# Patient Record
Sex: Male | Born: 1970 | Hispanic: No | Marital: Married | Smoking: Never smoker
Health system: Southern US, Community
[De-identification: ages and names within clinical notes are randomized; demographics above are authoritative.]

---

## 2017-09-04 ENCOUNTER — Encounter: Payer: Self-pay | Admitting: Urgent Care

## 2017-09-04 ENCOUNTER — Ambulatory Visit: Payer: BLUE CROSS/BLUE SHIELD | Admitting: Urgent Care

## 2017-09-04 ENCOUNTER — Other Ambulatory Visit: Payer: Self-pay

## 2017-09-04 VITALS — BP 118/74 | HR 93 | Temp 98.8°F | Resp 18 | Ht 67.0 in | Wt 187.6 lb

## 2017-09-04 DIAGNOSIS — R5383 Other fatigue: Secondary | ICD-10-CM

## 2017-09-04 DIAGNOSIS — Z114 Encounter for screening for human immunodeficiency virus [HIV]: Secondary | ICD-10-CM

## 2017-09-04 DIAGNOSIS — R110 Nausea: Secondary | ICD-10-CM | POA: Diagnosis not present

## 2017-09-04 DIAGNOSIS — M545 Low back pain, unspecified: Secondary | ICD-10-CM

## 2017-09-04 DIAGNOSIS — R5381 Other malaise: Secondary | ICD-10-CM | POA: Diagnosis not present

## 2017-09-04 DIAGNOSIS — R140 Abdominal distension (gaseous): Secondary | ICD-10-CM

## 2017-09-04 DIAGNOSIS — R519 Headache, unspecified: Secondary | ICD-10-CM

## 2017-09-04 DIAGNOSIS — R1013 Epigastric pain: Secondary | ICD-10-CM | POA: Diagnosis not present

## 2017-09-04 DIAGNOSIS — R51 Headache: Secondary | ICD-10-CM

## 2017-09-04 NOTE — Progress Notes (Signed)
   MRN: [id number] DOB: 04/05/1971  Subjective:   Douglas Ortiz is a 47 y.o. male presenting for 2 week history of low back pain (now resolved), warm sensation, abdominal bloating, intermittent epigastric pain, intermittent nausea, has been burping. Has also had headaches over right temporal side relieved with Advil, has felt fatigue. Denies fever, dizziness, sore throat, cough, chest pain, vomiting, diarrhea, constipation, decreased appetite, dysuria, hematuria, rashes. Denies smoking cigarettes or drinking alcohol. Patient quit smoking ~2 cigarettes daily. He does not do flu shots, is not up to date on vaccines. Hydrates well. Diet is mostly healthy. Sleeps 6-7 hours nightly.   Douglas Ortiz is not currently taking any medications and has No Known Allergies.  Douglas Ortiz denies past medical and surgical history. Denies family history of cancer, diabetes, HTN, HL, heart disease, stroke, mental illness.   Objective:   Vitals: BP 118/74 (BP Location: Left Arm, Patient Position: Sitting, Cuff Size: Large)   Pulse 93   Temp 98.8 F (37.1 C) (Oral)   Resp 18   Ht 5\' 7"  (1.702 m)   Wt 187 lb 9.6 oz (85.1 kg)   SpO2 100%   BMI 29.38 kg/m   Physical Exam  Constitutional: He is oriented to person, place, and time. He appears well-developed and well-nourished.  HENT:  Mouth/Throat: Oropharynx is clear and moist.  Eyes: Pupils are equal, round, and reactive to light. EOM are normal. No scleral icterus.  Neck: Normal range of motion. Neck supple. No thyromegaly present.  Cardiovascular: Normal rate, regular rhythm and intact distal pulses. Exam reveals no gallop and no friction rub.  No murmur heard. Pulmonary/Chest: No respiratory distress. He has no wheezes. He has no rales.  Abdominal: Soft. Bowel sounds are normal. He exhibits no distension and no mass. There is no tenderness. There is no guarding.  Musculoskeletal: He exhibits no edema.  Neurological: He is alert and oriented to person, place,  and time. He displays normal reflexes.  Skin: Skin is warm and dry. Capillary refill takes less than 2 seconds.  Psychiatric: He has a normal mood and affect.   Assessment and Plan :   Malaise and fatigue - Plan: CBC, TSH, Epstein-Barr virus VCA antibody panel, Vitamin B12  Generalized headaches - Plan: Comprehensive metabolic panel  Abdominal bloating  Abdominal pain, epigastric - Plan: CBC, HELICOBACTER PYLORI  ANTIBODY, IGM  Acute bilateral low back pain without sciatica  Nausea without vomiting  Screening for HIV (human immunodeficiency virus) - Plan: HIV antibody  Patient has multiple symptoms from different systems that do not fit any particular diagnosis. Labs pending. For now, recommended conservative management and general health maintenance. Will follow up with labs including treatment plan.  Wallis BambergMario Marionette Meskill, PA-C [facility] [phone] 09/04/2017  3:41 PM

## 2017-09-04 NOTE — Patient Instructions (Addendum)
You may take 500mg  Tylenol with ibuprofen 400-600mg  every 6 hours for pain and inflammation. Hydrate well with at least 2 liters (1 gallon) of water daily.     Fatigue Fatigue is feeling tired all of the time, a lack of energy, or a lack of motivation. Occasional or mild fatigue is often a normal response to activity or life in general. However, long-lasting (chronic) or extreme fatigue may indicate an underlying medical condition. Follow these instructions at home: Watch your fatigue for any changes. The following actions may help to lessen any discomfort you are feeling:  Talk to your health care provider about how much sleep you need each night. Try to get the required amount every night.  Take medicines only as directed by your health care provider.  Eat a healthy and nutritious diet. Ask your health care provider if you need help changing your diet.  Drink enough fluid to keep your urine clear or pale yellow.  Practice ways of relaxing, such as yoga, meditation, massage therapy, or acupuncture.  Exercise regularly.  Change situations that cause you stress. Try to keep your work and personal routine reasonable.  Do not abuse illegal drugs.  Limit alcohol intake to no more than 1 drink per day for nonpregnant women and 2 drinks per day for men. One drink equals 12 ounces of beer, 5 ounces of wine, or 1 ounces of hard liquor.  Take a multivitamin, if directed by your health care provider.  Contact a health care provider if:  Your fatigue does not get better.  You have a fever.  You have unintentional weight loss or gain.  You have headaches.  You have difficulty: ? Falling asleep. ? Sleeping throughout the night.  You feel angry, guilty, anxious, or sad.  You are unable to have a bowel movement (constipation).  You skin is dry.  Your legs or another part of your body is swollen. Get help right away if:  You feel confused.  Your vision is blurry.  You feel  faint or pass out.  You have a severe headache.  You have severe abdominal, pelvic, or back pain.  You have chest pain, shortness of breath, or an irregular or fast heartbeat.  You are unable to urinate or you urinate less than normal.  You develop abnormal bleeding, such as bleeding from the rectum, vagina, nose, lungs, or nipples.  You vomit blood.  You have thoughts about harming yourself or committing suicide.  You are worried that you might harm someone else. This information is not intended to replace advice given to you by your health care provider. Make sure you discuss any questions you have with your health care provider. Document Released: 03/17/2007 Document Revised: 10/26/2015 Document Reviewed: 09/21/2013 Elsevier Interactive Patient Education  2018 [employer].      Health Maintenance, Male A healthy lifestyle and preventive care is important for your health and wellness. Ask your health care provider about what schedule of regular examinations is right for you. What should I know about weight and diet? Eat a Healthy Diet  Eat plenty of vegetables, fruits, whole grains, low-fat dairy products, and lean protein.  Do not eat a lot of foods high in solid fats, added sugars, or salt.  Maintain a Healthy Weight Regular exercise can help you achieve or maintain a healthy weight. You should:  Do at least 150 minutes of exercise each week. The exercise should increase your heart rate and make you sweat (moderate-intensity exercise).  Do strength-training  exercises at least twice a week.  Watch Your Levels of Cholesterol and Blood Lipids  Have your blood tested for lipids and cholesterol every 5 years starting at 45 years of age. If you are at high risk for heart disease, you should start having your blood tested when you are 45 years old. You may need to have your cholesterol levels checked more often if: ? Your lipid or cholesterol levels are high. ? You are  older than 45 years of age. ? You are at high risk for heart disease.  What should I know about cancer screening? Many types of cancers can be detected early and may often be prevented. Lung Cancer  You should be screened every year for lung cancer if: ? You are a current smoker who has smoked for at least 30 years. ? You are a former smoker who has quit within the past 15 years.  Talk to your health care provider about your screening options, when you should start screening, and how often you should be screened.  Colorectal Cancer  Routine colorectal cancer screening usually begins at 45 years of age and should be repeated every 5-10 years until you are 45 years old. You may need to be screened more often if early forms of precancerous polyps or small growths are found. Your health care provider may recommend screening at an earlier age if you have risk factors for colon cancer.  Your health care provider may recommend using home test kits to check for hidden blood in the stool.  A small camera at the end of a tube can be used to examine your colon (sigmoidoscopy or colonoscopy). This checks for the earliest forms of colorectal cancer.  Prostate and Testicular Cancer  Depending on your age and overall health, your health care provider may do certain tests to screen for prostate and testicular cancer.  Talk to your health care provider about any symptoms or concerns you have about testicular or prostate cancer.  Skin Cancer  Check your skin from head to toe regularly.  Tell your health care provider about any new moles or changes in moles, especially if: ? There is a change in a mole's size, shape, or color. ? You have a mole that is larger than a pencil eraser.  Always use sunscreen. Apply sunscreen liberally and repeat throughout the day.  Protect yourself by wearing long sleeves, pants, a wide-brimmed hat, and sunglasses when outside.  What should I know about heart disease,  diabetes, and high blood pressure?  If you are 80-65 years of age, have your blood pressure checked every 3-5 years. If you are 43 years of age or older, have your blood pressure checked every year. You should have your blood pressure measured twice-once when you are at a hospital or clinic, and once when you are not at a hospital or clinic. Record the average of the two measurements. To check your blood pressure when you are not at a hospital or clinic, you can use: ? An automated blood pressure machine at a pharmacy. ? A home blood pressure monitor.  Talk to your health care provider about your target blood pressure.  If you are between 85-57 years old, ask your health care provider if you should take aspirin to prevent heart disease.  Have regular diabetes screenings by checking your fasting blood sugar level. ? If you are at a normal weight and have a low risk for diabetes, have this test once every three years after  the age of 45. ? If you are overweight and have a high risk for diabetes, consider being tested at a younger age or more often.  A one-time screening for abdominal aortic aneurysm (AAA) by ultrasound is recommended for men aged 65-75 years who are current or former smokers. What should I know about preventing infection? Hepatitis B If you have a higher risk for hepatitis B, you should be screened for this virus. Talk with your health care provider to find out if you are at risk for hepatitis B infection. Hepatitis C Blood testing is recommended for:  Everyone born from [birth year] through 1963.  Anyone with known risk factors for hepatitis C.  Sexually Transmitted Diseases (STDs)  You should be screened each year for STDs including gonorrhea and chlamydia if: ? You are sexually active and are younger than 45 years of age. ? You are older than 45 years of age and your health care provider tells you that you are at risk for this type of infection. ? Your sexual activity has  changed since you were last screened and you are at an increased risk for chlamydia or gonorrhea. Ask your health care provider if you are at risk.  Talk with your health care provider about whether you are at high risk of being infected with HIV. Your health care provider may recommend a prescription medicine to help prevent HIV infection.  What else can I do?  Schedule regular health, dental, and eye exams.  Stay current with your vaccines (immunizations).  Do not use any tobacco products, such as cigarettes, chewing tobacco, and e-cigarettes. If you need help quitting, ask your health care provider.  Limit alcohol intake to no more than 2 drinks per day. One drink equals 12 ounces of beer, 5 ounces of wine, or 1 ounces of hard liquor.  Do not use street drugs.  Do not share needles.  Ask your health care provider for help if you need support or information about quitting drugs.  Tell your health care provider if you often feel depressed.  Tell your health care provider if you have ever been abused or do not feel safe at home. This information is not intended to replace advice given to you by your health care provider. Make sure you discuss any questions you have with your health care provider. Document Released: 11/16/2007 Document Revised: 01/17/2016 Document Reviewed: 02/21/2015 Elsevier Interactive Patient Education  2018 [employer]      IF you received an x-ray today, you will receive an invoice from [facility]. Please contact [facility] at [phone] with questions or concerns regarding your invoice.   IF you received labwork today, you will receive an invoice from [facility]. Please contact [facility] at [phone] with questions or concerns regarding your invoice.   Our billing staff will not be able to assist you with questions regarding bills from these companies.  You will be contacted with the lab results as soon as they are available. The  fastest way to get your results is to activate your My Chart account. Instructions are located on the last page of this paperwork. If you have not heard from us regarding the results in 2 weeks, please contact this office.

## 2017-09-05 LAB — CBC
Hematocrit: 44.9 % (ref 37.5–51.0)
Hemoglobin: 15 g/dL (ref 13.0–17.7)
MCH: 30.6 pg (ref 26.6–33.0)
MCHC: 33.4 g/dL (ref 31.5–35.7)
MCV: 92 fL (ref 79–97)
PLATELETS: 310 10*3/uL (ref 150–379)
RBC: 4.9 x10E6/uL (ref 4.14–5.80)
RDW: 13.1 % (ref 12.3–15.4)
WBC: 4.4 10*3/uL (ref 3.4–10.8)

## 2017-09-05 LAB — COMPREHENSIVE METABOLIC PANEL
A/G RATIO: 1.9 (ref 1.2–2.2)
ALT: 31 IU/L (ref 0–44)
AST: 24 IU/L (ref 0–40)
Albumin: 4.7 g/dL (ref 3.5–5.5)
Alkaline Phosphatase: 83 IU/L (ref 39–117)
BILIRUBIN TOTAL: 0.4 mg/dL (ref 0.0–1.2)
BUN/Creatinine Ratio: 12 (ref 9–20)
BUN: 12 mg/dL (ref 6–24)
CALCIUM: 10 mg/dL (ref 8.7–10.2)
CHLORIDE: 100 mmol/L (ref 96–106)
CO2: 24 mmol/L (ref 20–29)
Creatinine, Ser: 0.97 mg/dL (ref 0.76–1.27)
GFR calc Af Amer: 109 mL/min/{1.73_m2} (ref >59)
GFR calc non Af Amer: 95 mL/min/{1.73_m2} (ref >59)
GLUCOSE: 99 mg/dL (ref 65–99)
Globulin, Total: 2.5 g/dL (ref 1.5–4.5)
POTASSIUM: 4.4 mmol/L (ref 3.5–5.2)
Sodium: 140 mmol/L (ref 134–144)
Total Protein: 7.2 g/dL (ref 6.0–8.5)

## 2017-09-05 LAB — EPSTEIN-BARR VIRUS VCA ANTIBODY PANEL
EBV Early Antigen Ab, IgG: <9 U/mL (ref 0.0–8.9)
EBV NA IgG: 147 U/mL — ABNORMAL HIGH (ref 0.0–17.9)
EBV VCA IgG: 538 U/mL — ABNORMAL HIGH (ref 0.0–17.9)
EBV VCA IgM: <36 U/mL (ref 0.0–35.9)

## 2017-09-05 LAB — HELICOBACTER PYLORI  ANTIBODY, IGM

## 2017-09-05 LAB — VITAMIN B12: Vitamin B-12: 402 pg/mL (ref 232–1245)

## 2017-09-05 LAB — HIV ANTIBODY (ROUTINE TESTING W REFLEX): HIV SCREEN 4TH GENERATION: NONREACTIVE

## 2017-09-05 LAB — TSH: TSH: 1.66 u[IU]/mL (ref 0.450–4.500)

## 2017-09-11 ENCOUNTER — Telehealth: Payer: Self-pay | Admitting: Urgent Care

## 2017-09-11 NOTE — Telephone Encounter (Signed)
Pt given results per notes of Jordan LikesMani Mario,PA on 09/08/17.Unable to document in result note due to result note not being [facility].Follow up appt scheduled for 4/12

## 2017-09-11 NOTE — Telephone Encounter (Signed)
Copied from CRM [id number]. Topic: General - Other >> Sep 11, 2017 11:40 AM Gerrianne ScalePayne, Bradly Sangiovanni L wrote: Reason for CRM: patient calling about lab results ok to give out per CRM

## 2017-09-12 ENCOUNTER — Ambulatory Visit: Payer: BLUE CROSS/BLUE SHIELD | Admitting: Urgent Care

## 2017-09-12 ENCOUNTER — Encounter: Payer: Self-pay | Admitting: Urgent Care

## 2017-09-12 ENCOUNTER — Other Ambulatory Visit: Payer: Self-pay

## 2017-09-12 ENCOUNTER — Ambulatory Visit (INDEPENDENT_AMBULATORY_CARE_PROVIDER_SITE_OTHER): Payer: BLUE CROSS/BLUE SHIELD

## 2017-09-12 VITALS — BP 118/70 | HR 67 | Temp 98.1°F | Resp 16 | Ht 67.0 in | Wt 186.0 lb

## 2017-09-12 DIAGNOSIS — R5381 Other malaise: Secondary | ICD-10-CM

## 2017-09-12 DIAGNOSIS — K5900 Constipation, unspecified: Secondary | ICD-10-CM | POA: Insufficient documentation

## 2017-09-12 DIAGNOSIS — R0683 Snoring: Secondary | ICD-10-CM

## 2017-09-12 DIAGNOSIS — R140 Abdominal distension (gaseous): Secondary | ICD-10-CM | POA: Diagnosis not present

## 2017-09-12 DIAGNOSIS — R5383 Other fatigue: Secondary | ICD-10-CM

## 2017-09-12 DIAGNOSIS — R0600 Dyspnea, unspecified: Secondary | ICD-10-CM | POA: Diagnosis not present

## 2017-09-12 DIAGNOSIS — R1030 Lower abdominal pain, unspecified: Secondary | ICD-10-CM

## 2017-09-12 DIAGNOSIS — R142 Eructation: Secondary | ICD-10-CM

## 2017-09-12 LAB — HEMOGLOBIN A1C
Est. average glucose Bld gHb Est-mCnc: 111 mg/dL
Hgb A1c MFr Bld: 5.5 % (ref 4.8–5.6)

## 2017-09-12 MED ORDER — DOCUSATE SODIUM 50 MG PO CAPS: 50.0000 mg | ORAL_CAPSULE | Freq: Two times a day (BID) | ORAL | 0 refills | Status: AC

## 2017-09-12 MED ORDER — RANITIDINE HCL 150 MG PO TABS
150.0000 mg | ORAL_TABLET | Freq: Two times a day (BID) | ORAL | 1 refills | Status: AC
Start: 2017-09-12 — End: ?

## 2017-09-12 MED ORDER — OMEPRAZOLE 20 MG PO CPDR: 20.0000 mg | DELAYED_RELEASE_CAPSULE | Freq: Every day | ORAL | 3 refills | Status: AC

## 2017-09-12 NOTE — Patient Instructions (Addendum)
Please start Colace (docusate) stool softener, twice a day for at least 1 week. If stools become loose, cut down to once a day for another week. If stools remain loose, cut back to 1 pill every other day for a third week. You can stop docusate thereafter and resume as needed for constipation.  To help reduce constipation and promote bowel health: 1. Drink at least 64 ounces of water each day 2. Eat plenty of fiber (fruits, vegetables, whole grains, legumes) 3. Be physically active or exercise including walking, jogging, swimming, yoga, etc. 4. For active constipation use a stool softener (docusate) or an osmotic laxative (like Miralax) each day, or as needed.  If you do not have bowel movement for 2-3 days, try a fleet enema as pictured below.       Fatigue Fatigue is feeling tired all of the time, a lack of energy, or a lack of motivation. Occasional or mild fatigue is often a normal response to activity or life in general. However, long-lasting (chronic) or extreme fatigue may indicate an underlying medical condition. Follow these instructions at home: Watch your fatigue for any changes. The following actions may help to lessen any discomfort you are feeling:  Talk to your health care provider about how much sleep you need each night. Try to get the required amount every night.  Take medicines only as directed by your health care provider.  Eat a healthy and nutritious diet. Ask your health care provider if you need help changing your diet.  Drink enough fluid to keep your urine clear or pale yellow.  Practice ways of relaxing, such as yoga, meditation, massage therapy, or acupuncture.  Exercise regularly.  Change situations that cause you stress. Try to keep your work and personal routine reasonable.  Do not abuse illegal drugs.  Limit alcohol intake to no more than 1 drink per day for nonpregnant women and 2 drinks per day for men. One drink equals 12 ounces of beer, 5 ounces  of wine, or 1 ounces of hard liquor.  Take a multivitamin, if directed by your health care provider.  Contact a health care provider if:  Your fatigue does not get better.  You have a fever.  You have unintentional weight loss or gain.  You have headaches.  You have difficulty: ? Falling asleep. ? Sleeping throughout the night.  You feel angry, guilty, anxious, or sad.  You are unable to have a bowel movement (constipation).  You skin is dry.  Your legs or another part of your body is swollen. Get help right away if:  You feel confused.  Your vision is blurry.  You feel faint or pass out.  You have a severe headache.  You have severe abdominal, pelvic, or back pain.  You have chest pain, shortness of breath, or an irregular or fast heartbeat.  You are unable to urinate or you urinate less than normal.  You develop abnormal bleeding, such as bleeding from the rectum, vagina, nose, lungs, or nipples.  You vomit blood.  You have thoughts about harming yourself or committing suicide.  You are worried that you might harm someone else. This information is not intended to replace advice given to you by your health care provider. Make sure you discuss any questions you have with your health care provider. Document Released: 03/17/2007 Document Revised: 10/26/2015 Document Reviewed: 09/21/2013 Elsevier Interactive Patient Education  [employer].     IF you received an x-ray today, you will receive an  invoice from [facility]. Please contact [facility] at [phone] with questions or concerns regarding your invoice.   IF you received labwork today, you will receive an invoice from [facility]. Please contact [facility] at [phone] with questions or concerns regarding your invoice.   Our billing staff will not be able to assist you with questions regarding bills from these companies.  You will be contacted with the lab results as soon as  they are available. The fastest way to get your results is to activate your My Chart account. Instructions are located on the last page of this paperwork. If you have not heard from us regarding the results in 2 weeks, please contact this office.

## 2017-09-12 NOTE — Progress Notes (Signed)
    MRN: [id number] DOB: 02/13/1971  Subjective:   Douglas Ortiz is a 47 y.o. male presenting for follow up on malaise and fatigue.  His last office visit on September 04, 2017, patient had full workup and all labs were equivocal.  Today, patient reports ongoing malaise and fatigue.  He also still has abdominal bloating and burping but no abdominal pain.  In the past week he started to have constipation, difficulty defecating, straining, hard stools.  He is trying to hydrate very well and is getting plenty of fiber.  His last bowel movement was this morning.  Denies decreased appetite, fever, bloody stools, pelvic fullness.  His wife reports that patient snores at night, sometimes he stops breathing for minute, patient has intermittent episodes of waking up feeling short of breath.  He has never tested for sleep apnea.  Douglas Ortiz is not currently taking any medications and has No Known Allergies.  Douglas Ortiz denies past medical and surgical history.   Objective:   Vitals: BP 118/70   Pulse 67   Temp 98.1 F (36.7 C) (Oral)   Resp 16   Ht 5\' 7"  (1.702 m)   Wt 186 lb (84.4 kg)   SpO2 98%   BMI 29.13 kg/m   Wt Readings from Last 3 Encounters:  09/12/17 186 lb (84.4 kg)  09/04/17 187 lb 9.6 oz (85.1 kg)    Physical Exam  Constitutional: He is oriented to person, place, and time. He appears well-developed and well-nourished.  HENT:  Mouth/Throat: Oropharynx is clear and moist.  Cardiovascular: Normal rate, regular rhythm and intact distal pulses. Exam reveals no gallop and no friction rub.  No murmur heard. Pulmonary/Chest: No respiratory distress. He has no wheezes. He has no rales.  Abdominal: Soft. Bowel sounds are normal. He exhibits no distension and no mass. There is tenderness (mild, lower). There is no rebound and no guarding.  Neurological: He is alert and oriented to person, place, and time.  Skin: Skin is warm and dry.  Psychiatric:  Flat affect.   Dg Abd 2 Views  Result  Date: 09/12/2017 CLINICAL DATA:  Is constipation, lower abdominal pain EXAM: ABDOMEN - 2 VIEW COMPARISON:  None. FINDINGS: Moderate stool burden. There is normal bowel gas pattern. No free air. No organomegaly or suspicious calcification. No acute bony abnormality. IMPRESSION: Moderate stool burden.  No acute findings. Electronically Signed   By: Charlett NoseKevin  Dover M.D.   On: 09/12/2017 10:26    Assessment and Plan :   Malaise and fatigue - Plan: Uric A+ANA+RA Qn+CRP+ASO, Ambulatory referral to Sleep Studies, Hemoglobin A1c  Abdominal bloating - Plan: Uric A+ANA+RA Qn+CRP+ASO, Hemoglobin A1c  Burping  PND (paroxysmal nocturnal dyspnea) - Plan: Ambulatory referral to Sleep Studies  Snoring - Plan: Ambulatory referral to Sleep Studies  Constipation, unspecified constipation type - Plan: DG Abd 2 Views  Lower abdominal pain - Plan: DG Abd 2 Views  Labs pending, will have patient start acid reflux medicine including Prilosec and Zantac.  Also reviewed constipation management with patient.  Will refer for sleep study.  If symptoms persist counseled that we will perform a DRE exam to check on his prostate.  Follow-up in 4 weeks.  Wallis BambergMario Easter Kennebrew, PA-C [facility] [phone] 09/12/2017 9:51 AM

## 2017-09-13 LAB — URIC A+ANA+RA QN+CRP+ASO
ANA: NEGATIVE
ASO: 109 [IU]/mL (ref 0.0–200.0)
CRP: 5.8 mg/L — AB (ref 0.0–4.9)
Uric Acid: 5.6 mg/dL (ref 3.7–8.6)

## 2017-11-07 ENCOUNTER — Ambulatory Visit (HOSPITAL_COMMUNITY)
Admission: RE | Admit: 2017-11-07 | Discharge: 2017-11-07 | Disposition: A | Discharge status: Home / Self Care | Payer: BLUE CROSS/BLUE SHIELD | Source: Ambulatory Visit | Attending: Obstetrics & Gynecology | Admitting: Obstetrics & Gynecology

## 2017-11-07 DIAGNOSIS — K5900 Constipation, unspecified: Secondary | ICD-10-CM | POA: Insufficient documentation

## 2017-11-07 DIAGNOSIS — R140 Abdominal distension (gaseous): Secondary | ICD-10-CM | POA: Diagnosis present

## 2017-11-07 DIAGNOSIS — R5383 Other fatigue: Secondary | ICD-10-CM | POA: Insufficient documentation

## 2017-11-07 DIAGNOSIS — R5381 Other malaise: Secondary | ICD-10-CM | POA: Diagnosis not present

## 2017-11-07 LAB — ROUTINE CHROMOSOME - KARYOTYPE

## 2017-11-11 ENCOUNTER — Ambulatory Visit: Payer: BLUE CROSS/BLUE SHIELD | Admitting: Neurology

## 2017-11-11 ENCOUNTER — Encounter: Payer: Self-pay | Admitting: Neurology

## 2017-11-11 VITALS — BP 123/80 | HR 67 | Ht 67.0 in | Wt 184.0 lb

## 2017-11-11 DIAGNOSIS — G4719 Other hypersomnia: Secondary | ICD-10-CM

## 2017-11-11 DIAGNOSIS — R51 Headache: Secondary | ICD-10-CM | POA: Diagnosis not present

## 2017-11-11 DIAGNOSIS — R0683 Snoring: Secondary | ICD-10-CM

## 2017-11-11 DIAGNOSIS — R351 Nocturia: Secondary | ICD-10-CM | POA: Diagnosis not present

## 2017-11-11 DIAGNOSIS — R0681 Apnea, not elsewhere classified: Secondary | ICD-10-CM

## 2017-11-11 DIAGNOSIS — E663 Overweight: Secondary | ICD-10-CM | POA: Diagnosis not present

## 2017-11-11 DIAGNOSIS — R519 Headache, unspecified: Secondary | ICD-10-CM

## 2017-11-11 NOTE — Progress Notes (Signed)
Subjective:    Patient ID: Douglas Ortiz is a 47 y.o. male.  HPI     Douglas Foley, MD, PhD [facility] [address] [address] [place], [place] [address]  Dear Marquita Palms,   I saw your patient, Douglas Ortiz, upon your kind request, in my neurologic clinic today for initial consultation of his sleep disorder, in particular, concern for obstructive sleep apnea. The patient is accompanied by his wife today. As you know, Douglas Ortiz is a 47 year old right-handed gentleman with an underlying medical history of reflux disease, chronic constipation,   and overweight state, who reports snoring and excessive daytime somnolence as well as witnessed apneas per wife's report. He has woken up with a sense of gasping for air which was scary to him. I reviewed your office note from 09/12/2017. His Epworth sleepiness score is 10 out of 24, fatigue score is 22 out of 63. He is married and lives with his wife, they have no children. He is a nonsmoker and does not use alcohol, he drinks caffeine in the form of coffee, 1-2 cups per day on average. He works for [employer], Catering manager. He works second shift, has worked second shift for or for 5 years, over one year in this job. His bedtime is between 2 and 3 AM, arrived time between 11 AM and 1 PM typically. He has nocturia about once or twice per average night, he has woken up with the occasional headache. His headaches seem to be worse when his constipation is worse. He has lost some weight. He complains of dry mouth. He had recent lab work done which I reviewed. He denies restless leg symptoms, his wife has not noticed any leg twitching while he is asleep but he has had some twitching in his hands at times. He has difficulty maintaining sleep. He does not watch TV in bed.   His Past Medical History Is Significant For: No past medical history on file.  His Past Surgical History Is  Significant For: No past surgical history on file.  His Family History Is Significant For: No family history on file.  His Social History Is Significant For: Social History   Socioeconomic History  . Marital status: Married    Spouse name: Not on file  . Number of children: Not on file  . Years of education: Not on file  . Highest education level: Not on file  Occupational History  . Not on file  Social Needs  . Financial resource strain: Not on file  . Food insecurity:    Worry: Not on file    Inability: Not on file  . Transportation needs:    Medical: Not on file    Non-medical: Not on file  Tobacco Use  . Smoking status: Never Smoker  . Smokeless tobacco: Never Used  Substance and Sexual Activity  . Alcohol use: Never    Frequency: Never  . Drug use: Never  . Sexual activity: Yes  Lifestyle  . Physical activity:    Days per week: Not on file    Minutes per session: Not on file  . Stress: Not on file  Relationships  . Social connections:    Talks on phone: Not on file    Gets together: Not on file    Attends religious service: Not on file    Active member of club or organization: Not on file    Attends meetings of clubs or organizations:  Not on file    Relationship status: Not on file  Other Topics Concern  . Not on file  Social History Narrative  . Not on file    His Allergies Are:  No Known Allergies:   His Current Medications Are:  Outpatient Encounter Medications as of 11/11/2017  Medication Sig  . docusate sodium (COLACE) 50 MG capsule Take 1 capsule (50 mg total) by mouth 2 (two) times daily.  Marland Kitchen omeprazole (PRILOSEC) 20 MG capsule Take 1 capsule (20 mg total) by mouth daily.  . ranitidine (ZANTAC) 150 MG tablet Take 1 tablet (150 mg total) by mouth 2 (two) times daily.   No facility-administered encounter medications on file as of 11/11/2017.   :  Review of Systems:  Out of a complete 14 point review of systems, all are reviewed and negative  with the exception of these symptoms as listed below: Review of Systems  Neurological:       Pt presents today to discuss his sleep. Pt has never had a sleep study but does endorse snoring.  Epworth Sleepiness Scale 0= would never doze 1= slight chance of dozing 2= moderate chance of dozing 3= high chance of dozing  Sitting and reading: 1 Watching TV: 2 Sitting inactive in a public place (ex. Theater or meeting): 1 As a passenger in a car for an hour without a break: 0 Lying down to rest in the afternoon: 1 Sitting and talking to someone: 2 Sitting quietly after lunch (no alcohol): 0 In a car, while stopped in traffic: 3 Total: 10     Objective:  Neurological Exam  Physical Exam Physical Examination:   Vitals:   11/11/17 1312  BP: 123/80  Pulse: 67   General Examination: The patient is a very pleasant 47 y.o. male in no acute distress. He appears well-developed and well-nourished and well groomed.   HEENT: Normocephalic, atraumatic, pupils are equal, round and reactive to light and accommodation. Extraocular tracking is good without limitation to gaze excursion or nystagmus noted. Normal smooth pursuit is noted. Hearing is grossly intact. Face is symmetric with normal facial animation and normal facial sensation. Speech is clear with no dysarthria noted. There is no hypophonia. There is no lip, neck/head, jaw or voice tremor. Neck is supple with full range of passive and active motion. There are no carotid bruits on auscultation. Oropharynx exam reveals: mild mouth dryness, adequate dental hygiene and mild airway crowding, due to smaller airway entry and tonsils in place of 1+ b/l. Mallampati is class II. Tongue protrudes centrally and palate elevates symmetrically. Neck size is 15 1/8 inches. Nasal inspection reveals no significant nasal mucosal bogginess, mild redness and mild septal deviation to the R.   Chest: Clear to auscultation without wheezing, rhonchi or crackles  noted.  Heart: S1+S2+0, regular and normal without murmurs, rubs or gallops noted.   Abdomen: Soft, non-tender and non-distended with normal bowel sounds appreciated on auscultation.  Extremities: There is no pitting edema in the distal lower extremities bilaterally. Pedal pulses are intact.  Skin: Warm and dry without trophic changes noted.  Musculoskeletal: exam reveals no obvious joint deformities, tenderness or joint swelling or erythema.   Neurologically:  Mental status: The patient is awake, alert and oriented in all 4 spheres. His immediate and remote memory, attention, language skills and fund of knowledge are appropriate. There is no evidence of aphasia, agnosia, apraxia or anomia. Speech is clear with normal prosody and enunciation. Thought process is linear. Mood is normal and affect  is normal.  Cranial nerves II - XII are as described above under HEENT exam. In addition: shoulder shrug is normal with equal shoulder height noted. Motor exam: Normal bulk, strength and tone is noted. There is no drift, tremor or rebound. Romberg is negative. Reflexes are 2+ throughout. Fine motor skills and coordination: grossly intact.  Cerebellar testing: No dysmetria or intention tremor on finger to nose testing. Heel to shin is unremarkable bilaterally. There is no truncal or gait ataxia.  Sensory exam: intact to light touch in the upper and lower extremities.  Gait, station and balance: He stands easily. No veering to one side is noted. No leaning to one side is noted. Posture is age-appropriate and stance is narrow based. Gait shows normal stride length and normal pace. No problems turning are noted. Tandem walk is unremarkable.               Assessment and Plan:  In summary, Douglas Ortiz is a very pleasant 47 y.o.-year old male with an underlying medical history of reflux disease, chronic constipation,   and overweight state, whose history and physical exam are concerning for obstructive  sleep apnea (OSA). I had a long chat with the patient and his wife about my findings and the diagnosis of OSA, its prognosis and treatment options. We talked about medical treatments, surgical interventions and non-pharmacological approaches. I explained in particular the risks and ramifications of untreated moderate to severe OSA, especially with respect to developing cardiovascular disease down the Road, including congestive heart failure, difficult to treat hypertension, cardiac arrhythmias, or stroke. Even type 2 diabetes has, in part, been linked to untreated OSA. Symptoms of untreated OSA include daytime sleepiness, memory problems, mood irritability and mood disorder such as depression and anxiety, lack of energy, as well as recurrent headaches, especially morning headaches. We talked about trying to maintain a healthy lifestyle in general, as well as the importance of weight control. I encouraged the patient to eat healthy, exercise daily and keep well hydrated, to keep a scheduled bedtime and wake time routine, to not skip any meals and eat healthy snacks in between meals. I advised the patient not to drive when feeling sleepy. I recommended the following at this time: sleep study with potential positive airway pressure titration. (We will score hypopneas at 3%).   I explained the sleep test procedure to the patient and also outlined possible surgical and non-surgical treatment options of OSA, including the use of a custom-made dental device (which would require a referral to a specialist dentist or oral surgeon), upper airway surgical options, such as pillar implants, radiofrequency surgery, tongue base surgery, and UPPP (which would involve a referral to an ENT surgeon). Rarely, jaw surgery such as mandibular advancement may be considered.  I also explained the CPAP treatment option to the patient, who indicated that he would be willing to try CPAP if the need arises. I explained the importance of  being compliant with PAP treatment, not only for insurance purposes but primarily to improve His symptoms, and for the patient's long term health benefit, including to reduce His cardiovascular risks. I answered all their questions today and the patient and his wife were in agreement. I plan to see him back after the sleep study is completed and encouraged him to call with any interim questions, concerns, problems or updates.   Thank you very much for allowing me to participate in the care of this nice patient. If I can be of any further assistance  to you please do not hesitate to call me at [phone].  Sincerely,   Star Age, MD, PhD

## 2017-11-11 NOTE — Patient Instructions (Addendum)
Thank you for choosing [facility] for your sleep related care! It was nice to meet you today! I appreciate that you entrust me with your sleep related healthcare concerns. I hope, I was able to address at least some of your concerns today, and that I can help you feel reassured and also get better.    Here is what we discussed today and what we came up with as our plan for you:    Based on your symptoms and your exam I believe you are at risk for obstructive sleep apnea (aka OSA), and I think we should proceed with a sleep study to determine whether you do or do not have OSA and how severe it is. Even, if you have mild OSA, I may want you to consider treatment with CPAP, as treatment of even borderline or mild sleep apnea can result and improvement of symptoms such as sleep disruption, daytime sleepiness, nighttime bathroom breaks, restless leg symptoms, improvement of headache syndromes, even improved mood disorder.   Please remember, the long-term risks and ramifications of untreated moderate to severe obstructive sleep apnea are: increased Cardiovascular disease, including congestive heart failure, stroke, difficult to control hypertension, treatment resistant obesity, arrhythmias, especially irregular heartbeat commonly known as A. Fib. (atrial fibrillation); even type 2 diabetes has been linked to untreated OSA.   Sleep apnea can cause disruption of sleep and sleep deprivation in most cases, which, in turn, can cause recurrent headaches, problems with memory, mood, concentration, focus, and vigilance. Most people with untreated sleep apnea report excessive daytime sleepiness, which can affect their ability to drive. Please do not drive if you feel sleepy. Patients with sleep apnea developed difficulty initiating and maintaining sleep (aka insomnia).   Having sleep apnea may increase your risk for other sleep disorders, including involuntary behaviors sleep such as sleep terrors,  sleep talking, sleepwalking.    Having sleep apnea can also increase your risk for restless leg syndrome and leg movements at night.   Please note that untreated obstructive sleep apnea may carry additional perioperative morbidity. Patients with significant obstructive sleep apnea (typically, in the moderate to severe degree) should receive, if possible, perioperative PAP (positive airway pressure) therapy and the surgeons and particularly the anesthesiologists should be informed of the diagnosis and the severity of the sleep disordered breathing.   I will likely see you back after your sleep study to go over the test results and where to go from there. We will call you after your sleep study to advise about the results (most likely, you will hear from MathewsKristen, my nurse) and to set up an appointment at the time, as necessary.    Our sleep lab administrative assistant will call you to schedule your sleep study and give you further instructions, regarding the check in process for the sleep study, arrival time, what to bring, when you can expect to leave after the study, etc., and to answer any other logistical questions you may have. If you don't hear back from her by about 2 weeks from now, please feel free to call her direct line at [phone] or you can call our general clinic number, or email us through My Chart.   Please look into using a salt water nasal rinse system, such as the EchoStareti Pot and follow the directions, as you have had some nasal congestion. Don't use a decongestant spray like Afrin chronically.

## 2017-11-14 ENCOUNTER — Telehealth: Payer: Self-pay

## 2017-11-14 DIAGNOSIS — G4719 Other hypersomnia: Secondary | ICD-10-CM

## 2017-11-14 NOTE — Telephone Encounter (Signed)
BCBS denied in lab sleep study, need HST order 

## 2017-11-14 NOTE — Telephone Encounter (Signed)
VO for HST from Dr. Athar received. HST order placed.  

## 2017-12-05 ENCOUNTER — Other Ambulatory Visit (HOSPITAL_COMMUNITY): Payer: Self-pay

## 2017-12-17 ENCOUNTER — Ambulatory Visit (INDEPENDENT_AMBULATORY_CARE_PROVIDER_SITE_OTHER): Payer: BLUE CROSS/BLUE SHIELD | Admitting: Neurology

## 2017-12-17 DIAGNOSIS — G4733 Obstructive sleep apnea (adult) (pediatric): Secondary | ICD-10-CM | POA: Diagnosis not present

## 2017-12-17 DIAGNOSIS — G4719 Other hypersomnia: Secondary | ICD-10-CM

## 2018-01-08 ENCOUNTER — Telehealth: Payer: Self-pay | Admitting: Neurology

## 2018-01-08 NOTE — Telephone Encounter (Signed)
Called patient to discuss sleep study results. No answer at this time. LVM for the patient to call back.   

## 2018-01-08 NOTE — Addendum Note (Signed)
Addended by: Huston FoleyATHAR, Lakeya Mulka on: 01/08/2018 08:21 AM   Modules accepted: Orders

## 2018-01-08 NOTE — Telephone Encounter (Signed)
-----   Message from Huston FoleySaima Athar, MD sent at 01/08/2018  8:21 AM EDT ----- Patient referred by Wallis BambergMario Mani, seen by me on 11/11/17, HST on 12/18/17.    Please call and notify the patient that the recent home sleep test showed obstructive sleep apnea in the severe range. While I recommend treatment for this in the form CPAP, his insurance will not approve a sleep study for this. They will likely only approve a trial of autoPAP, which means, that we don't have to bring him in for a sleep study with CPAP, but will let him try an autoPAP machine at home, through a DME company (of his choice, or as per insurance requirement). The DME representative will educate him on how to use the machine, how to put the mask on, etc. I have placed an order in the chart. Please send referral, talk to patient, send report to referring MD. We will need a FU in sleep clinic for 10 weeks post-PAP set up, please arrange that with me or one of our NPs. Thanks,   Huston FoleySaima Athar, MD, PhD [facility])

## 2018-01-08 NOTE — Progress Notes (Signed)
Patient referred by Wallis BambergMario Mani, seen by me on 11/11/17, HST on 12/18/17.    Please call and notify the patient that the recent home sleep test showed obstructive sleep apnea in the severe range. While I recommend treatment for this in the form CPAP, his insurance will not approve a sleep study for this. They will likely only approve a trial of autoPAP, which means, that we don't have to bring him in for a sleep study with CPAP, but will let him try an autoPAP machine at home, through a DME company (of his choice, or as per insurance requirement). The DME representative will educate him on how to use the machine, how to put the mask on, etc. I have placed an order in the chart. Please send referral, talk to patient, send report to referring MD. We will need a FU in sleep clinic for 10 weeks post-PAP set up, please arrange that with me or one of our NPs. Thanks,   Huston FoleySaima Maxcine Strong, MD, PhD [facility])

## 2018-01-08 NOTE — Procedures (Signed)
[facility] [address] [place], [place] [id number] NAME:  Douglas Ortiz                                                     DOB: [birth year] MEDICAL RECORD NUMBER  [id number]                                           DOS:  12/18/17 REFERRING PHYSICIAN: Wallis BambergMario Mani PA-C STUDY PERFORMED: Home Sleep Test HISTORY: 47 year old with a history of reflux disease, chronic constipation, and overweight state, who reports snoring and excessive daytime somnolence as well as witnessed apneas per wife's report. His Epworth sleepiness score is 10 out of 24, BMI: 29.1.  STUDY RESULTS:  Total Recording Time: 7 hours, 6 minutes (valid test time: 6 hours, 25 min) Total Apnea/Hypopnea Index (AHI): 42.9/h, RDI: 46.1/h Average Oxygen Saturation:  96%, Lowest Oxygen Desaturation: 77%  Total Time Oxygen Saturation Below or at 88%:  7.7 minutes  Average Heart Rate: 58 bpm (between 40 and 96 bpm) IMPRESSION: OSA RECOMMENDATION: This home sleep test demonstrates severe obstructive sleep apnea with a total AHI of 42.9/hour and O2 nadir of 77%. Given the severity of the sleep disordered breathing and the patient's d sleep related complaints, treatment with positive airway pressure (in the form of CPAP) is recommended. This will ideally require a full night CPAP titration study for proper treatment settings, O2 monitoring and mask fitting. Based on the severity of the sleep disordered breathing an attended titration study is indicated. However, patient's insurance has denied an attended sleep study; therefore, the patient will be advised to proceed with an autoPAP titration/trial at home for now. Please note that untreated obstructive sleep apnea may carry additional perioperative morbidity. Patients with significant obstructive sleep apnea should receive perioperative PAP therapy and the surgeons and particularly the anesthesiologist should be informed of the diagnosis and the severity of the sleep  disordered breathing. The patient should be cautioned not to drive, work at heights, or operate dangerous or heavy equipment when tired or sleepy. Review and reiteration of good sleep hygiene measures should be pursued with any patient. Other causes of the patient's symptoms, including circadian rhythm disturbances, an underlying mood disorder, medication effect and/or an underlying medical problem cannot be ruled out based on this test. Clinical correlation is recommended. The patient and his referring provider will be notified of the test results. [facility]  I certify that I have reviewed the raw data recording prior to the issuance of this report in accordance with the standards of the American Academy of Sleep Medicine (AASM).   Huston FoleySaima Zailynn Brandel, MD, PhD [facility] ([facility]), Diplomat, ABPN (Neurology and Sleep)

## 2018-01-13 NOTE — Telephone Encounter (Signed)
I called pt. I advised pt that Dr. Frances FurbishAthar reviewed their sleep study results and found that pt pt has severe osa. Dr. Frances FurbishAthar recommends that pt start an auto pap at home, since pt's insurance will not approve a cpap titration study. I reviewed PAP compliance expectations with the pt. Pt is agreeable to starting an auto-PAP. I advised pt that an order will be sent to a DME, [facility], and [facility] will call the pt within about one week after they file with the pt's insurance. [facility] will show the pt how to use the machine, fit for masks, and troubleshoot the auto-PAP if needed. A follow up appt was made for insurance purposes with Dr. Frances FurbishAthar on 04/14/18 at 11:30am. Pt verbalized understanding to arrive 15 minutes early and bring their auto-PAP. A letter with all of this information in it will be mailed to the pt as a reminder. I verified with the pt that the address we have on file is correct. Pt verbalized understanding of results. Pt had no questions at this time but was encouraged to call back if questions arise.

## 2018-01-13 NOTE — Telephone Encounter (Signed)
I called pt to discuss his sleep study results. No answer, left a message asking him to call me back. 

## 2018-04-14 ENCOUNTER — Ambulatory Visit: Payer: Self-pay | Admitting: Neurology

## 2019-09-09 IMAGING — DX DG ABDOMEN 2V
3 series · 3 of 3 positions shown · non-contrast
Comparison: None.

CLINICAL DATA: Is constipation, lower abdominal pain

EXAM:
ABDOMEN - 2 VIEW

[abdomen erect]
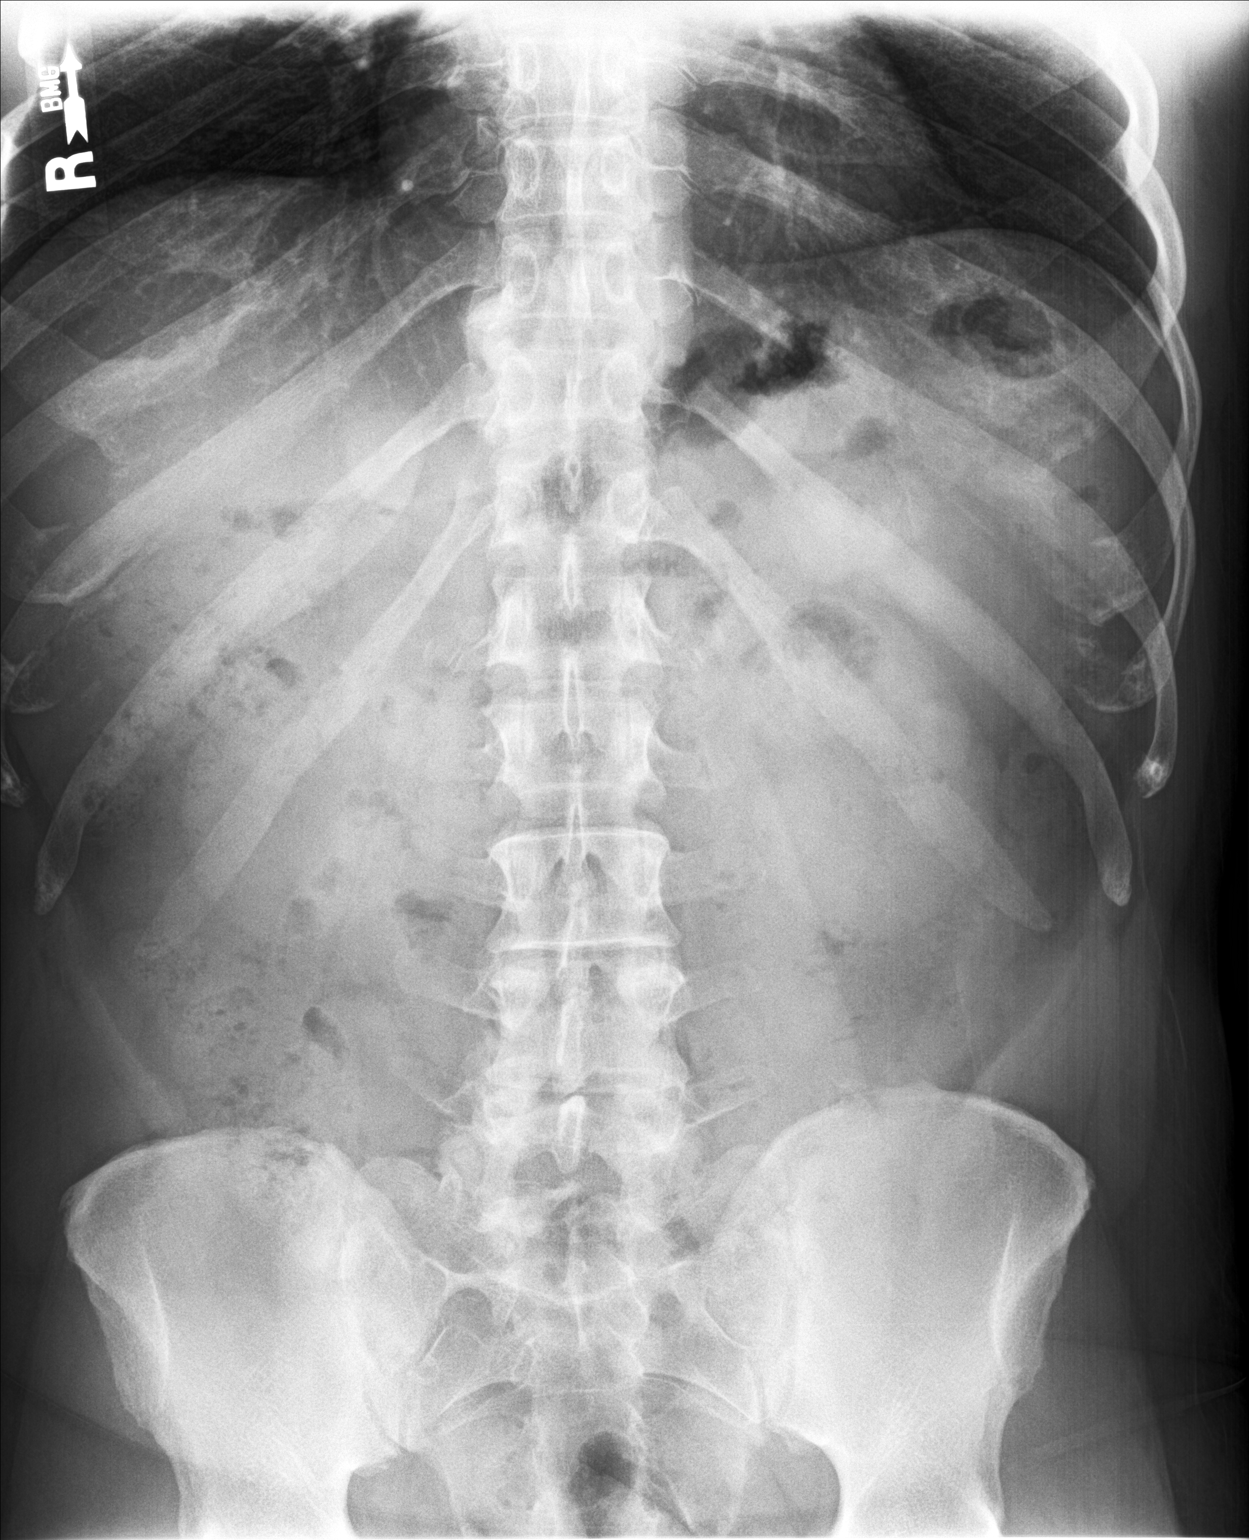

[abdomen supine (1 of 2)]
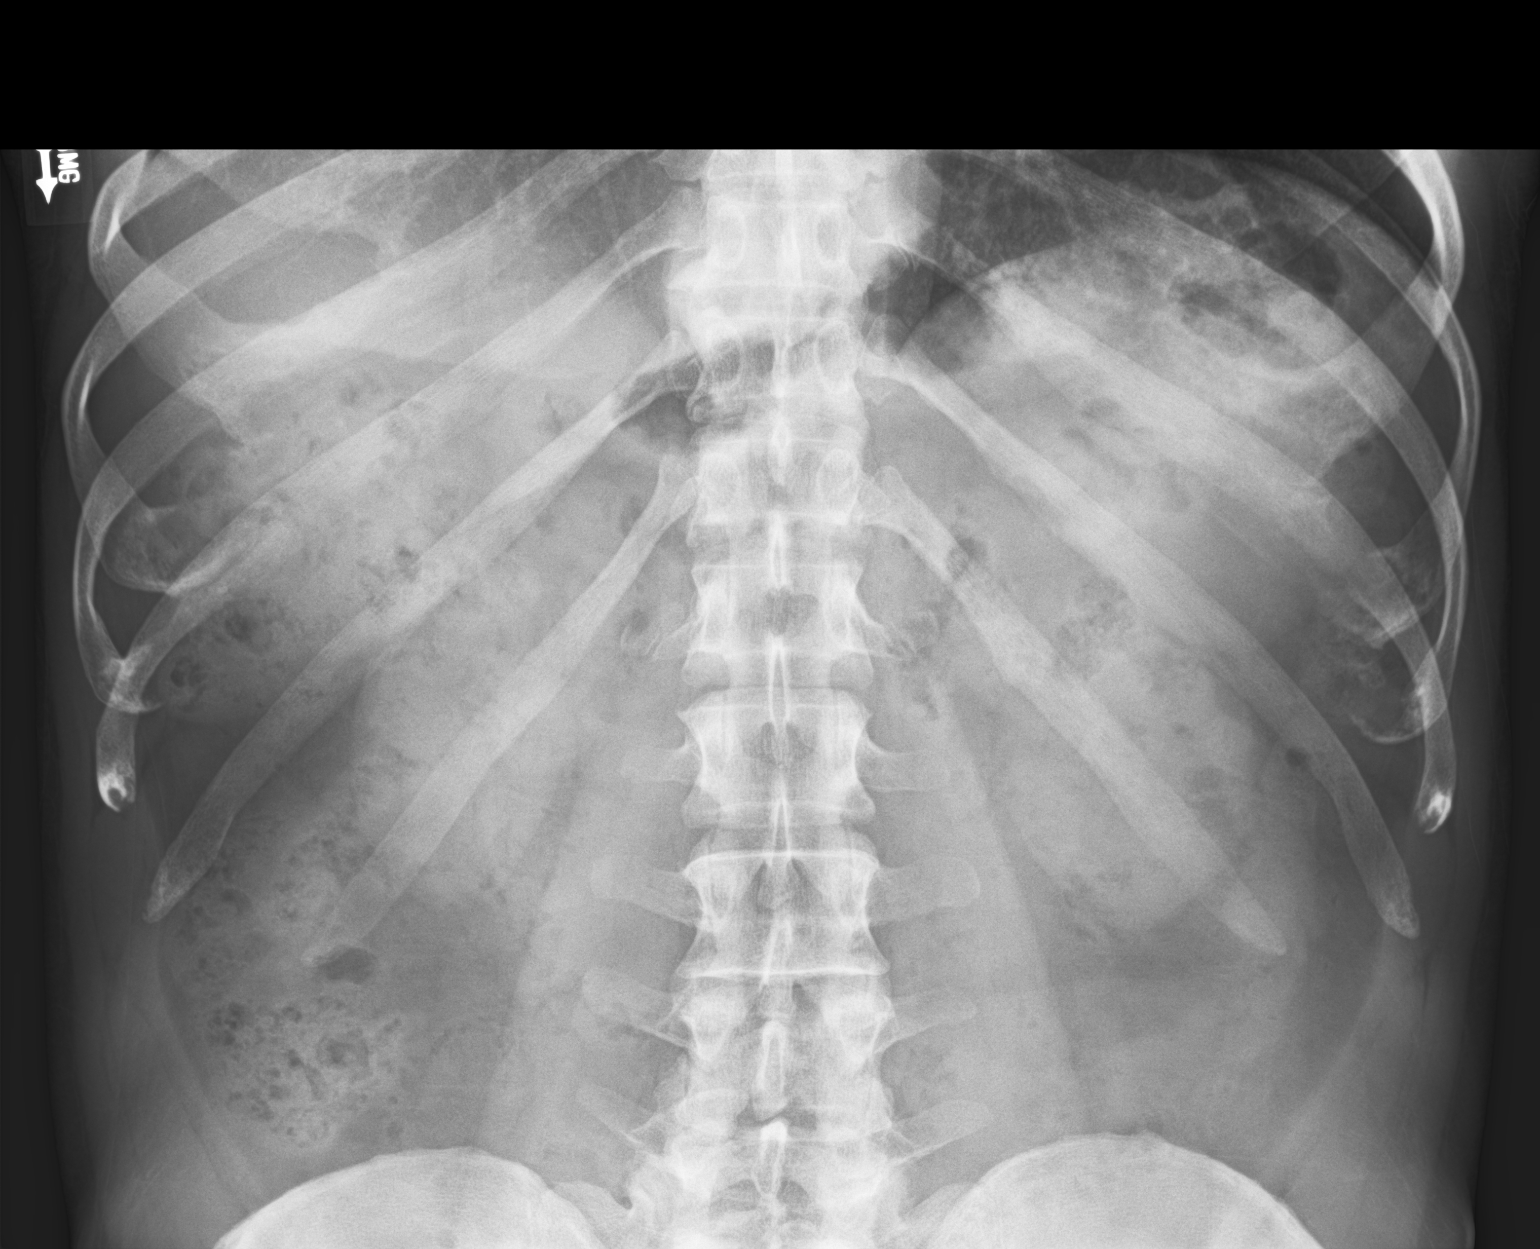

[abdomen supine (2 of 2)]
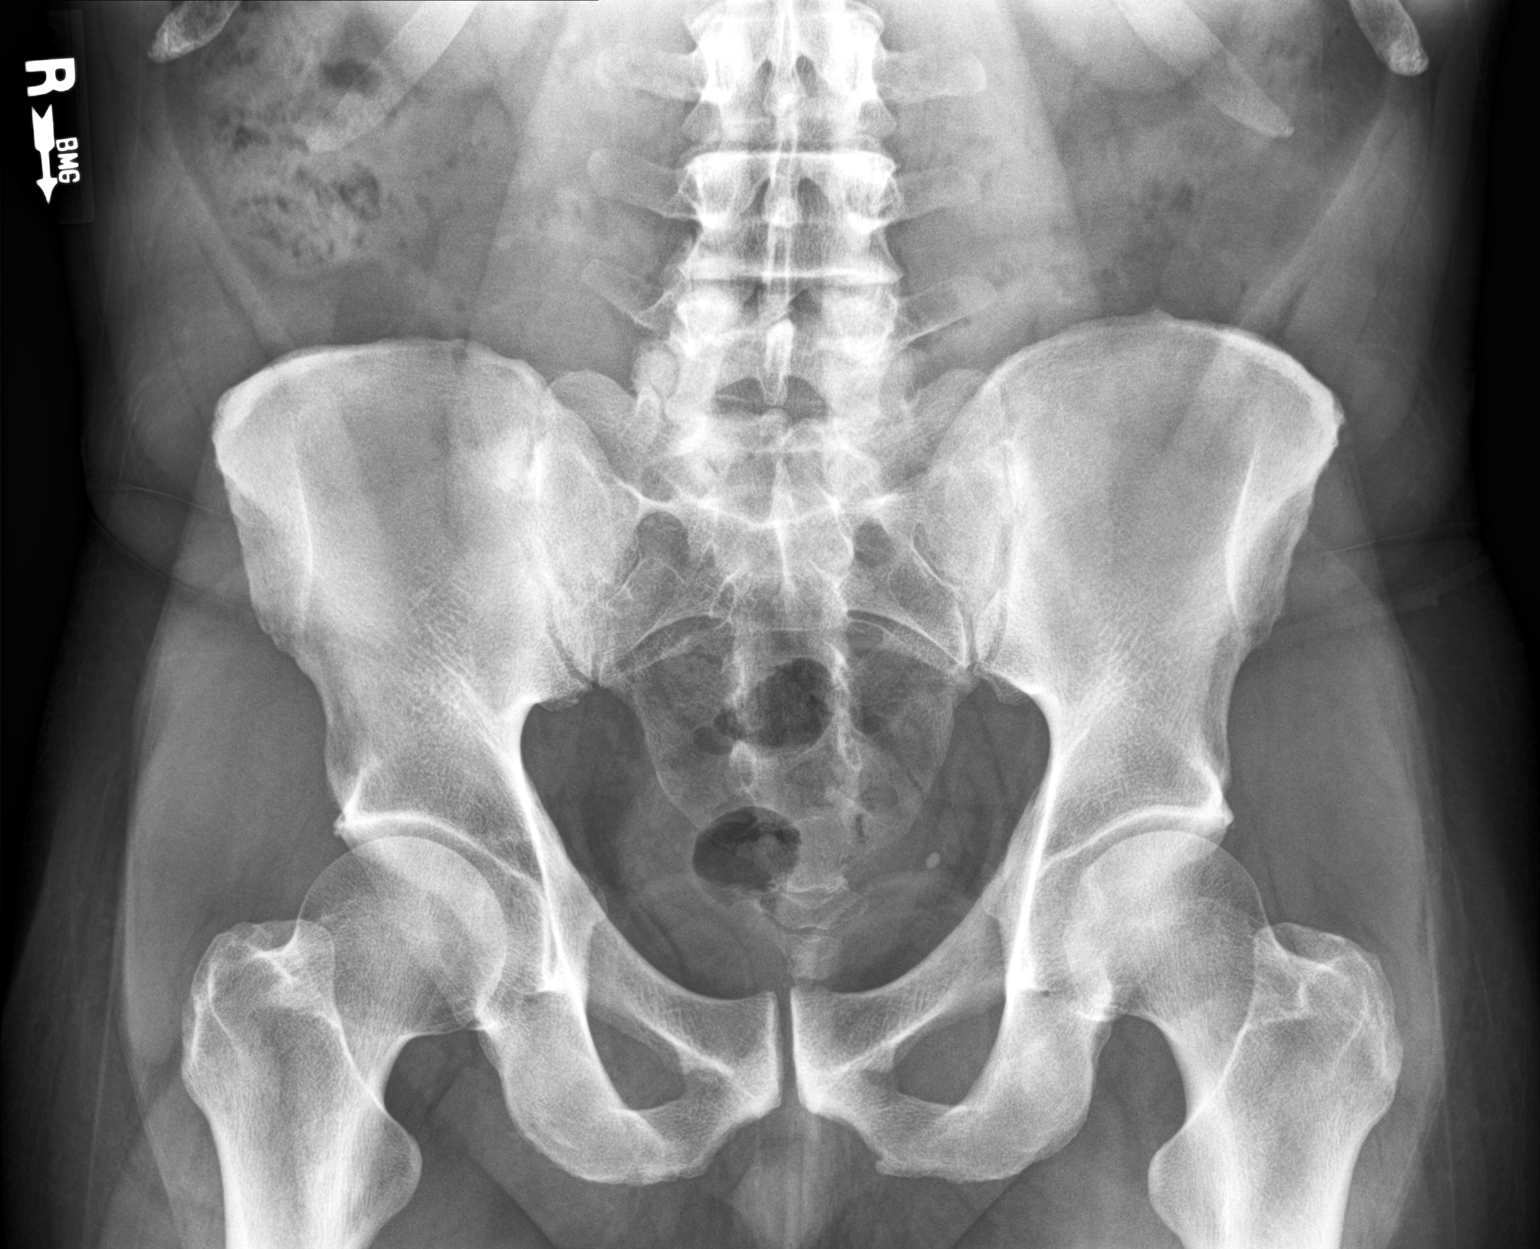

[3 of 3 positions shown; findings below may reference images not displayed]

FINDINGS: Moderate stool burden. There is normal bowel gas pattern. No free
air. No organomegaly or suspicious calcification. No acute bony
abnormality.
IMPRESSION: Moderate stool burden.  No acute findings.
